# Patient Record
Sex: Female | Born: 1950 | Race: White | Hispanic: No | Marital: Single | Smoking: Former smoker
Health system: Southern US, Community
[De-identification: ages and names within clinical notes are randomized; demographics above are authoritative.]

## PROBLEM LIST (undated history)

## (undated) DIAGNOSIS — E079 Disorder of thyroid, unspecified: Secondary | ICD-10-CM

## (undated) DIAGNOSIS — E785 Hyperlipidemia, unspecified: Secondary | ICD-10-CM

## (undated) DIAGNOSIS — I10 Essential (primary) hypertension: Secondary | ICD-10-CM

## (undated) DIAGNOSIS — E119 Type 2 diabetes mellitus without complications: Secondary | ICD-10-CM

## (undated) DIAGNOSIS — J45909 Unspecified asthma, uncomplicated: Secondary | ICD-10-CM

## (undated) HISTORY — DX: Disorder of thyroid, unspecified: E07.9

## (undated) HISTORY — DX: Essential (primary) hypertension: I10

## (undated) HISTORY — DX: Type 2 diabetes mellitus without complications: E11.9

## (undated) HISTORY — DX: Unspecified asthma, uncomplicated: J45.909

## (undated) HISTORY — PX: WISDOM TOOTH EXTRACTION: SHX21

## (undated) HISTORY — DX: Hyperlipidemia, unspecified: E78.5

---

## 2020-06-13 DIAGNOSIS — E109 Type 1 diabetes mellitus without complications: Secondary | ICD-10-CM | POA: Insufficient documentation

## 2020-07-01 DIAGNOSIS — E785 Hyperlipidemia, unspecified: Secondary | ICD-10-CM | POA: Insufficient documentation

## 2021-03-25 ENCOUNTER — Other Ambulatory Visit: Payer: Self-pay | Admitting: Student

## 2021-03-25 DIAGNOSIS — R0989 Other specified symptoms and signs involving the circulatory and respiratory systems: Secondary | ICD-10-CM

## 2021-04-03 ENCOUNTER — Ambulatory Visit
Admission: RE | Admit: 2021-04-03 | Discharge: 2021-04-03 | Disposition: A | Discharge status: Home / Self Care | Payer: Medicare Other | Source: Ambulatory Visit | Attending: Student | Admitting: Student

## 2021-04-03 DIAGNOSIS — R0989 Other specified symptoms and signs involving the circulatory and respiratory systems: Secondary | ICD-10-CM

## 2021-04-28 ENCOUNTER — Other Ambulatory Visit (HOSPITAL_COMMUNITY): Payer: Self-pay | Admitting: Student

## 2021-04-28 DIAGNOSIS — I10 Essential (primary) hypertension: Secondary | ICD-10-CM

## 2021-05-07 ENCOUNTER — Other Ambulatory Visit: Payer: Self-pay

## 2021-05-07 ENCOUNTER — Encounter: Payer: Self-pay | Admitting: Vascular Surgery

## 2021-05-07 ENCOUNTER — Ambulatory Visit (INDEPENDENT_AMBULATORY_CARE_PROVIDER_SITE_OTHER): Payer: Medicare Other | Admitting: Vascular Surgery

## 2021-05-07 VITALS — BP 182/95 | HR 50 | Temp 98.2°F | Resp 20 | Ht 65.0 in | Wt 171.0 lb

## 2021-05-07 DIAGNOSIS — M25561 Pain in right knee: Secondary | ICD-10-CM | POA: Diagnosis not present

## 2021-05-07 DIAGNOSIS — M25562 Pain in left knee: Secondary | ICD-10-CM

## 2021-05-07 NOTE — Progress Notes (Signed)
Patient ID: Linda Warren, female   DOB: 1950-07-09, 71 y.o.   MRN: [id number]  Reason for Consult: New Patient (Initial Visit)   Referred by Hillery Aldo, NP  Subjective:     HPI:  Linda Warren is a 71 y.o. female without previous vascular history.  She is a former smoker having quit about 20 years ago.  She also has diabetes, hyperlipidemia hypertension which she states are all well controlled.  She does have leg pain this has been a chronic issue.  Mostly the leg pain is from the knees down.  She has been taking Requip to help with restless leg syndrome and this helps quite a bit.  She does not have any claudication.  She has no tissue loss or ulceration.  She denies any previous history of stroke, TIA or amaurosis.  She does not have a personal or family history of aneurysm disease.  She states that she cannot take aspirin due to underlying kidney disease.  Past Medical History:  Diagnosis Date   Asthma    Diabetes mellitus without complication (HCC)    Hyperlipidemia    Hypertension    Thyroid disease    No family history on file.   Short Social History:  Social History   Tobacco Use   Smoking status: Former    Types: Cigarettes   Smokeless tobacco: Never  Substance Use Topics   Alcohol use: Never    Allergies  Allergen Reactions   Nsaids     Other reaction(s): Other    Current Outpatient Medications  Medication Sig Dispense Refill   albuterol (PROVENTIL) (2.5 MG/3ML) 0.083% nebulizer solution Take 2.5 mg by nebulization every 6 (six) hours as needed.     albuterol (VENTOLIN HFA) 108 (90 Base) MCG/ACT inhaler Inhale into the lungs.     furosemide (LASIX) 20 MG tablet Take 20 mg by mouth 2 (two) times daily.     insulin lispro (HUMALOG) 100 UNIT/ML KwikPen Inject into the skin.     LANTUS SOLOSTAR 100 UNIT/ML Solostar Pen Inject into the skin.     levothyroxine (SYNTHROID) 125 MCG tablet Take 125 mcg by mouth daily.     losartan (COZAAR) 25 MG tablet losartan 25 mg  tablet  TAKE ONE TABLET BY MOUTH EVERY DAY     metoprolol tartrate (LOPRESSOR) 100 MG tablet Take 100 mg by mouth 2 (two) times daily.     pravastatin (PRAVACHOL) 10 MG tablet Take 10 mg by mouth at bedtime.     rOPINIRole (REQUIP) 0.5 MG tablet Take 0.5 mg by mouth 2 (two) times daily.     cloNIDine (CATAPRES) 0.1 MG tablet clonidine HCl 0.1 mg tablet  Take 1 tablet twice a day by oral route. (Patient not taking: Reported on 05/07/2021)     omeprazole (PRILOSEC) 20 MG capsule Take by mouth. (Patient not taking: Reported on 05/07/2021)     No current facility-administered medications for this visit.    Review of Systems  Constitutional:  Constitutional negative. HENT: HENT negative.  Eyes: Eyes negative.  Respiratory: Respiratory negative.  Cardiovascular: Cardiovascular negative.  GI: Gastrointestinal negative.  Musculoskeletal: Positive for back pain, gait problem and leg pain.  Hematologic: Hematologic/lymphatic negative.  Psychiatric: Psychiatric negative.       Objective:  Objective  Vitals:   05/07/21 1048  BP: (!) 182/95  Pulse: (!) 50  Resp: 20  Temp: 98.2 F (36.8 C)  SpO2: 96%     Physical Exam HENT:     Head: Normocephalic.  Nose:     Comments: Wearing a mask Eyes:     Pupils: Pupils are equal, round, and reactive to light.  Neck:     Vascular: No carotid bruit.  Cardiovascular:     Rate and Rhythm: Normal rate.     Pulses:          Radial pulses are 2+ on the right side and 2+ on the left side.     Heart sounds: No murmur heard. Pulmonary:     Effort: Pulmonary effort is normal.  Abdominal:     General: Abdomen is flat.     Palpations: Abdomen is soft. There is no mass.  Musculoskeletal:        General: Normal range of motion.     Cervical back: Normal range of motion and neck supple.     Right lower leg: No edema.     Left lower leg: No edema.  Skin:    General: Skin is warm and dry.     Capillary Refill: Capillary refill takes less than 2  seconds.  Neurological:     General: No focal deficit present.     Mental Status: She is alert.  Psychiatric:        Mood and Affect: Mood normal.        Thought Content: Thought content normal.    Data: Right Lower Extremity   Resting ABI:  1.12   Resting TBI: 0.51   Segmental Pressures: Noncompressible vessels throughout the thigh and proximal calf. Great toe pressure: 86   Arterial Waveforms: Normal tri-phasic arterial waveforms.   PVRs: Dampened digital PVRs. Otherwise normal PVRs with maintained waveform amplitude, augmentation and quality.   Left Lower Extremity:   Resting ABI: 1.11   Resting TBI: 0.59   Segmental Pressures: Noncompressible vessels throughout the thigh and proximal calf. Great toe pressure: 100   Arterial Waveforms: Normal tri-phasic arterial waveforms.   PVRs: Dampened digital PVRs. Otherwise normal PVRs with maintained waveform amplitude, augmentation and quality.   Other: Symmetric upper extremity pressures.   IMPRESSION: 1. 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 2. Normal ankle-brachial indices, however likely falsely elevated given impression #1.   Assessment/Plan:    71 year old female with leg pain and multiple risk factors for underlying vascular disease although with palpable pulses and no longer a smoker.  She does not have any evidence of arterial insufficiency.  I recommended continued walking and patient cannot take an antiplatelet agent at this time.  She will follow-up with me on an as-needed basis.     Maeola Harman MD [facility]

## 2021-05-08 ENCOUNTER — Ambulatory Visit (HOSPITAL_COMMUNITY)
Admission: RE | Admit: 2021-05-08 | Discharge: 2021-05-08 | Disposition: A | Discharge status: Home / Self Care | Payer: Medicare Other | Source: Ambulatory Visit | Attending: Student | Admitting: Student

## 2021-05-08 DIAGNOSIS — I10 Essential (primary) hypertension: Secondary | ICD-10-CM | POA: Diagnosis present

## 2021-05-08 DIAGNOSIS — E785 Hyperlipidemia, unspecified: Secondary | ICD-10-CM | POA: Diagnosis not present

## 2021-05-08 DIAGNOSIS — I351 Nonrheumatic aortic (valve) insufficiency: Secondary | ICD-10-CM | POA: Insufficient documentation

## 2021-05-08 LAB — ECHOCARDIOGRAM COMPLETE
Area-P 1/2: 3.08 cm2
Calc EF: 65.2 %
P 1/2 time: 754 msec
S' Lateral: 3.3 cm
Single Plane A2C EF: 67.7 %
Single Plane A4C EF: 63.5 %

## 2021-05-08 NOTE — Progress Notes (Signed)
°  Echocardiogram 2D Echocardiogram has been performed.  Leta Jungling M 05/08/2021, 12:11 PM

## 2021-06-17 ENCOUNTER — Ambulatory Visit (HOSPITAL_BASED_OUTPATIENT_CLINIC_OR_DEPARTMENT_OTHER)
Admission: RE | Admit: 2021-06-17 | Discharge: 2021-06-17 | Disposition: A | Discharge status: Home / Self Care | Payer: Medicare Other | Source: Ambulatory Visit | Attending: Student | Admitting: Student

## 2021-06-17 ENCOUNTER — Other Ambulatory Visit: Payer: Self-pay

## 2021-06-17 ENCOUNTER — Other Ambulatory Visit (HOSPITAL_BASED_OUTPATIENT_CLINIC_OR_DEPARTMENT_OTHER): Payer: Self-pay | Admitting: Student

## 2021-06-17 DIAGNOSIS — R059 Cough, unspecified: Secondary | ICD-10-CM | POA: Diagnosis present

## 2021-06-17 DIAGNOSIS — R0600 Dyspnea, unspecified: Secondary | ICD-10-CM | POA: Insufficient documentation

## 2021-07-21 ENCOUNTER — Other Ambulatory Visit: Payer: Self-pay | Admitting: Registered Nurse

## 2021-07-21 DIAGNOSIS — E2839 Other primary ovarian failure: Secondary | ICD-10-CM

## 2021-07-21 DIAGNOSIS — Z1231 Encounter for screening mammogram for malignant neoplasm of breast: Secondary | ICD-10-CM

## 2021-08-05 ENCOUNTER — Ambulatory Visit: Payer: Medicare Other

## 2021-08-12 ENCOUNTER — Other Ambulatory Visit: Payer: Medicare Other

## 2021-09-15 ENCOUNTER — Other Ambulatory Visit: Payer: Self-pay | Admitting: Registered Nurse

## 2021-09-15 DIAGNOSIS — E2839 Other primary ovarian failure: Secondary | ICD-10-CM

## 2021-09-16 ENCOUNTER — Other Ambulatory Visit: Payer: Medicare Other

## 2022-01-07 ENCOUNTER — Encounter: Payer: Self-pay | Admitting: Podiatry

## 2022-01-07 ENCOUNTER — Ambulatory Visit (INDEPENDENT_AMBULATORY_CARE_PROVIDER_SITE_OTHER): Payer: Medicare Other | Admitting: Podiatry

## 2022-01-07 DIAGNOSIS — Q828 Other specified congenital malformations of skin: Secondary | ICD-10-CM | POA: Diagnosis not present

## 2022-01-07 DIAGNOSIS — I10 Essential (primary) hypertension: Secondary | ICD-10-CM | POA: Insufficient documentation

## 2022-01-07 DIAGNOSIS — E039 Hypothyroidism, unspecified: Secondary | ICD-10-CM | POA: Insufficient documentation

## 2022-01-07 NOTE — Progress Notes (Signed)
Subjective:  Patient ID: Linda Warren, female    DOB: Mar 20, 1951,  MRN: [id number]  Chief Complaint  Patient presents with   Foot Ulcer    71 y.o. female presents with the above complaint.  Patient presents with complaint of right fifth metatarsal base hyperkeratotic lesion/callus/porokeratosis.  Patient said pain for touch is progressive gotten worse.  She wanted to get evaluated make sure that there is no ulceration.  She is a diabetic with last A1c of 7.5%.  She ambulates with regular shoes.  She does not wear diabetic shoes.  She has not seen and was prior to seeing me.  Pain scale is 5 out of 10 aching in nature.   Review of Systems: Negative except as noted in the HPI. Denies N/V/F/Ch.  Past Medical History:  Diagnosis Date   Asthma    Diabetes mellitus without complication ([place])    Hyperlipidemia    Hypertension    Thyroid disease     Current Outpatient Medications:    albuterol (PROVENTIL) (2.5 MG/3ML) 0.083% nebulizer solution, Take 2.5 mg by nebulization every 6 (six) hours as needed., Disp: , Rfl:    albuterol (VENTOLIN HFA) 108 (90 Base) MCG/ACT inhaler, Inhale into the lungs., Disp: , Rfl:    amoxicillin-clavulanate (AUGMENTIN) 500-125 MG tablet, Take 1 tablet by mouth 2 (two) times daily., Disp: , Rfl:    atorvastatin (LIPITOR) 40 MG tablet, Take 1 tablet by mouth daily., Disp: , Rfl:    butalbital-acetaminophen-caffeine (FIORICET) 50-325-40 MG tablet, Take 1 tablet by mouth every 4 (four) hours as needed., Disp: , Rfl:    cephALEXin (KEFLEX) 250 MG capsule, TAKE TWO CAPSULES BY MOUTH TWICE DAILY FOR 7 DAYS, Disp: , Rfl:    ciprofloxacin (CIPRO) 500 MG tablet, TAKE 1 TABLET BY MOUTH ONCE DAILY FOR 7 DAYS, Disp: , Rfl:    cloNIDine (CATAPRES) 0.1 MG tablet, clonidine HCl 0.1 mg tablet  Take 1 tablet twice a day by oral route. (Patient not taking: Reported on 05/07/2021), Disp: , Rfl:    cyclobenzaprine (FLEXERIL) 5 MG tablet, , Disp: , Rfl:    doxycycline (MONODOX) 100 MG  capsule, Take 100 mg by mouth 2 (two) times daily., Disp: , Rfl:    DULoxetine (CYMBALTA) 20 MG capsule, Take 20 mg by mouth daily., Disp: , Rfl:    fluticasone (FLONASE) 50 MCG/ACT nasal spray, INHALE ONE SPRAY IN EACH NOSTRIL EVERY DAY, Disp: , Rfl:    furosemide (LASIX) 20 MG tablet, Take 20 mg by mouth 2 (two) times daily., Disp: , Rfl:    gabapentin (NEURONTIN) 400 MG capsule, , Disp: , Rfl:    HYDROcodone-acetaminophen (NORCO/VICODIN) 5-325 MG tablet, Take 1 tablet by mouth every 6 (six) hours as needed for moderate pain., Disp: , Rfl:    insulin lispro (HUMALOG) 100 UNIT/ML KwikPen, Inject into the skin., Disp: , Rfl:    insulin NPH Human (HUMULIN N) 100 UNIT/ML injection, INJECT 10 UNITS SUBCUTANEOUSLY THREE TIMES DAILY, Disp: , Rfl:    LANTUS SOLOSTAR 100 UNIT/ML Solostar Pen, Inject into the skin., Disp: , Rfl:    levothyroxine (SYNTHROID) 100 MCG tablet, Take 100 mcg by mouth daily., Disp: , Rfl:    levothyroxine (SYNTHROID) 125 MCG tablet, Take 125 mcg by mouth daily., Disp: , Rfl:    loratadine (CLARITIN) 10 MG tablet, Take 1 tablet by mouth daily., Disp: , Rfl:    losartan (COZAAR) 100 MG tablet, Take 1 tablet by mouth daily., Disp: , Rfl:    losartan (COZAAR) 25 MG tablet,  losartan 25 mg tablet  TAKE ONE TABLET BY MOUTH EVERY DAY, Disp: , Rfl:    losartan (COZAAR) 25 MG tablet, Take 1 tablet by mouth daily., Disp: , Rfl:    methocarbamol (ROBAXIN) 500 MG tablet, Take 500 mg by mouth 3 (three) times daily., Disp: , Rfl:    metoprolol succinate (TOPROL-XL) 100 MG 24 hr tablet, Take 100 mg by mouth daily., Disp: , Rfl:    metoprolol tartrate (LOPRESSOR) 100 MG tablet, Take 100 mg by mouth 2 (two) times daily., Disp: , Rfl:    NEUPRO 1 MG/24HR PT24, Place onto the skin., Disp: , Rfl:    omeprazole (PRILOSEC) 20 MG capsule, Take by mouth. (Patient not taking: Reported on 05/07/2021), Disp: , Rfl:    pravastatin (PRAVACHOL) 10 MG tablet, Take 10 mg by mouth at bedtime., Disp: , Rfl:     rOPINIRole (REQUIP) 0.5 MG tablet, Take 0.5 mg by mouth 2 (two) times daily., Disp: , Rfl:    valsartan (DIOVAN) 160 MG tablet, Take 160 mg by mouth daily., Disp: , Rfl:    venlafaxine XR (EFFEXOR-XR) 37.5 MG 24 hr capsule, Take 37.5 mg by mouth daily., Disp: , Rfl:   Social History   Tobacco Use  Smoking Status Former   Types: Cigarettes  Smokeless Tobacco Never    Allergies  Allergen Reactions   Nsaids Other (See Comments)    Other reaction(s): Other   Objective:  There were no vitals filed for this visit. There is no height or weight on file to calculate BMI. Constitutional Well developed. Well nourished.  Vascular Dorsalis pedis pulses palpable bilaterally. Posterior tibial pulses palpable bilaterally. Capillary refill normal to all digits.  No cyanosis or clubbing noted. Pedal hair growth normal.  Neurologic Normal speech. Oriented to person, place, and time. Epicritic sensation to light touch grossly present bilaterally.  Dermatologic Hyperkeratotic lesion with central nucleated core noted to right submetatarsal base.  Pain on palpation to the lesion.  No pinpoint bleeding noted.  No underlying ulceration noted.    Orthopedic: Normal joint ROM without pain or crepitus bilaterally. No visible deformities. No bony tenderness.   Radiographs: None Assessment:   1. Porokeratosis    Plan:  Patient was evaluated and treated and all questions answered.  Right fifth metatarsal base porokeratosis -All questions and concerns were discussed with the patient in extensive detail.  Given the amount of pain that she is especially benefit from debridement of the lesion using chisel blade to handle the lesion was debrided down to healthy dry tissue.  No complication noted no pinpoint bleeding noted. -I discussed shoe gear modification as well.  No follow-ups on file.  Right fifth metatarsal base callus/porokeratosis though no ulcerations A1c 7.5

## 2022-01-29 ENCOUNTER — Other Ambulatory Visit: Payer: Self-pay | Admitting: Student

## 2022-01-29 DIAGNOSIS — E2839 Other primary ovarian failure: Secondary | ICD-10-CM

## 2022-04-21 ENCOUNTER — Ambulatory Visit: Payer: Medicare Other | Admitting: Podiatry

## 2022-04-30 ENCOUNTER — Ambulatory Visit (INDEPENDENT_AMBULATORY_CARE_PROVIDER_SITE_OTHER): Payer: 59 | Admitting: Podiatry

## 2022-04-30 VITALS — BP 136/78

## 2022-04-30 DIAGNOSIS — Q828 Other specified congenital malformations of skin: Secondary | ICD-10-CM | POA: Diagnosis not present

## 2022-04-30 NOTE — Progress Notes (Signed)
Subjective:  Patient ID: Linda Warren, female    DOB: 1950-06-04,  MRN: [id number]  Chief Complaint  Patient presents with   Callouses    72 y.o. female presents with the above complaint.  Patient presents for follow-up of right fifth metatarsal base porokeratotic lesion.  Patient states it came back again debridement does give her relief she denies any other acute complaints she is a diabetic care was  Review of Systems: Negative except as noted in the HPI. Denies N/V/F/Ch.  Past Medical History:  Diagnosis Date   Asthma    Diabetes mellitus without complication ([place])    Hyperlipidemia    Hypertension    Thyroid disease     Current Outpatient Medications:    albuterol (PROVENTIL) (2.5 MG/3ML) 0.083% nebulizer solution, Take 2.5 mg by nebulization every 6 (six) hours as needed., Disp: , Rfl:    albuterol (VENTOLIN HFA) 108 (90 Base) MCG/ACT inhaler, Inhale into the lungs., Disp: , Rfl:    amoxicillin-clavulanate (AUGMENTIN) 500-125 MG tablet, Take 1 tablet by mouth 2 (two) times daily., Disp: , Rfl:    atorvastatin (LIPITOR) 40 MG tablet, Take 1 tablet by mouth daily., Disp: , Rfl:    butalbital-acetaminophen-caffeine (FIORICET) 50-325-40 MG tablet, Take 1 tablet by mouth every 4 (four) hours as needed., Disp: , Rfl:    cephALEXin (KEFLEX) 250 MG capsule, TAKE TWO CAPSULES BY MOUTH TWICE DAILY FOR 7 DAYS, Disp: , Rfl:    ciprofloxacin (CIPRO) 500 MG tablet, TAKE 1 TABLET BY MOUTH ONCE DAILY FOR 7 DAYS, Disp: , Rfl:    cloNIDine (CATAPRES) 0.1 MG tablet, clonidine HCl 0.1 mg tablet  Take 1 tablet twice a day by oral route. (Patient not taking: Reported on 05/07/2021), Disp: , Rfl:    cyclobenzaprine (FLEXERIL) 5 MG tablet, , Disp: , Rfl:    doxycycline (MONODOX) 100 MG capsule, Take 100 mg by mouth 2 (two) times daily., Disp: , Rfl:    DULoxetine (CYMBALTA) 20 MG capsule, Take 20 mg by mouth daily., Disp: , Rfl:    fluticasone (FLONASE) 50 MCG/ACT nasal spray, INHALE ONE SPRAY IN EACH  NOSTRIL EVERY DAY, Disp: , Rfl:    furosemide (LASIX) 20 MG tablet, Take 20 mg by mouth 2 (two) times daily., Disp: , Rfl:    gabapentin (NEURONTIN) 400 MG capsule, , Disp: , Rfl:    HYDROcodone-acetaminophen (NORCO/VICODIN) 5-325 MG tablet, Take 1 tablet by mouth every 6 (six) hours as needed for moderate pain., Disp: , Rfl:    insulin lispro (HUMALOG) 100 UNIT/ML KwikPen, Inject into the skin., Disp: , Rfl:    insulin NPH Human (HUMULIN N) 100 UNIT/ML injection, INJECT 10 UNITS SUBCUTANEOUSLY THREE TIMES DAILY, Disp: , Rfl:    LANTUS SOLOSTAR 100 UNIT/ML Solostar Pen, Inject into the skin., Disp: , Rfl:    levothyroxine (SYNTHROID) 100 MCG tablet, Take 100 mcg by mouth daily., Disp: , Rfl:    levothyroxine (SYNTHROID) 125 MCG tablet, Take 125 mcg by mouth daily., Disp: , Rfl:    loratadine (CLARITIN) 10 MG tablet, Take 1 tablet by mouth daily., Disp: , Rfl:    losartan (COZAAR) 100 MG tablet, Take 1 tablet by mouth daily., Disp: , Rfl:    losartan (COZAAR) 25 MG tablet, losartan 25 mg tablet  TAKE ONE TABLET BY MOUTH EVERY DAY, Disp: , Rfl:    losartan (COZAAR) 25 MG tablet, Take 1 tablet by mouth daily., Disp: , Rfl:    methocarbamol (ROBAXIN) 500 MG tablet, Take 500 mg by mouth 3 (  three) times daily., Disp: , Rfl:    metoprolol succinate (TOPROL-XL) 100 MG 24 hr tablet, Take 100 mg by mouth daily., Disp: , Rfl:    metoprolol tartrate (LOPRESSOR) 100 MG tablet, Take 100 mg by mouth 2 (two) times daily., Disp: , Rfl:    NEUPRO 1 MG/24HR PT24, Place onto the skin., Disp: , Rfl:    omeprazole (PRILOSEC) 20 MG capsule, Take by mouth. (Patient not taking: Reported on 05/07/2021), Disp: , Rfl:    pravastatin (PRAVACHOL) 10 MG tablet, Take 10 mg by mouth at bedtime., Disp: , Rfl:    rOPINIRole (REQUIP) 0.5 MG tablet, Take 0.5 mg by mouth 2 (two) times daily., Disp: , Rfl:    valsartan (DIOVAN) 160 MG tablet, Take 160 mg by mouth daily., Disp: , Rfl:    venlafaxine XR (EFFEXOR-XR) 37.5 MG 24 hr  capsule, Take 37.5 mg by mouth daily., Disp: , Rfl:   Social History   Tobacco Use  Smoking Status Former   Types: Cigarettes  Smokeless Tobacco Never    Allergies  Allergen Reactions   Nsaids Other (See Comments)    Other reaction(s): Other   Objective:   Vitals:   04/30/22 1401  BP: 136/78   There is no height or weight on file to calculate BMI. Constitutional Well developed. Well nourished.  Vascular Dorsalis pedis pulses palpable bilaterally. Posterior tibial pulses palpable bilaterally. Capillary refill normal to all digits.  No cyanosis or clubbing noted. Pedal hair growth normal.  Neurologic Normal speech. Oriented to person, place, and time. Epicritic sensation to light touch grossly present bilaterally.  Dermatologic Hyperkeratotic lesion with central nucleated core noted to right submetatarsal base.  Pain on palpation to the lesion.  No pinpoint bleeding noted.  No underlying ulceration noted.    Orthopedic: Normal joint ROM without pain or crepitus bilaterally. No visible deformities. No bony tenderness.   Radiographs: None Assessment:   No diagnosis found.  Plan:  Patient was evaluated and treated and all questions answered.  Right fifth metatarsal base porokeratosis -All questions and concerns were discussed with the patient in extensive detail.  Given the amount of pain that she is especially benefit from debridement of the lesion using chisel blade to handle the lesion was debrided down to healthy dry tissue.  No complication noted no pinpoint bleeding noted. -I discussed shoe gear modification as well.  No follow-ups on file.  Right fifth metatarsal base callus/porokeratosis though no ulcerations A1c 7.5

## 2022-07-19 ENCOUNTER — Emergency Department (HOSPITAL_COMMUNITY)
Admission: EM | Admit: 2022-07-19 | Discharge: 2022-07-19 | Disposition: A | Discharge status: Home / Self Care | Payer: 59 | Attending: Emergency Medicine | Admitting: Emergency Medicine

## 2022-07-19 ENCOUNTER — Other Ambulatory Visit: Payer: Self-pay

## 2022-07-19 ENCOUNTER — Encounter (HOSPITAL_COMMUNITY): Payer: Self-pay

## 2022-07-19 ENCOUNTER — Emergency Department (HOSPITAL_COMMUNITY): Payer: 59

## 2022-07-19 DIAGNOSIS — R0602 Shortness of breath: Secondary | ICD-10-CM | POA: Diagnosis not present

## 2022-07-19 DIAGNOSIS — S0033XA Contusion of nose, initial encounter: Secondary | ICD-10-CM | POA: Diagnosis not present

## 2022-07-19 DIAGNOSIS — Z794 Long term (current) use of insulin: Secondary | ICD-10-CM | POA: Diagnosis not present

## 2022-07-19 DIAGNOSIS — Z79899 Other long term (current) drug therapy: Secondary | ICD-10-CM | POA: Insufficient documentation

## 2022-07-19 DIAGNOSIS — X58XXXA Exposure to other specified factors, initial encounter: Secondary | ICD-10-CM | POA: Insufficient documentation

## 2022-07-19 DIAGNOSIS — R55 Syncope and collapse: Secondary | ICD-10-CM | POA: Diagnosis not present

## 2022-07-19 DIAGNOSIS — E860 Dehydration: Secondary | ICD-10-CM | POA: Diagnosis not present

## 2022-07-19 DIAGNOSIS — S0992XA Unspecified injury of nose, initial encounter: Secondary | ICD-10-CM | POA: Diagnosis present

## 2022-07-19 DIAGNOSIS — Y92002 Bathroom of unspecified non-institutional (private) residence single-family (private) house as the place of occurrence of the external cause: Secondary | ICD-10-CM | POA: Insufficient documentation

## 2022-07-19 DIAGNOSIS — Y9301 Activity, walking, marching and hiking: Secondary | ICD-10-CM | POA: Insufficient documentation

## 2022-07-19 LAB — CBC WITH DIFFERENTIAL/PLATELET
Abs Immature Granulocytes: 0.02 10*3/uL (ref 0.00–0.07)
Basophils Absolute: 0 10*3/uL (ref 0.0–0.1)
Basophils Relative: 0 %
Eosinophils Absolute: 0.2 10*3/uL (ref 0.0–0.5)
Eosinophils Relative: 3 %
HCT: 37.9 % (ref 36.0–46.0)
Hemoglobin: 12.5 g/dL (ref 12.0–15.0)
Immature Granulocytes: 0 %
Lymphocytes Relative: 17 %
Lymphs Abs: 1.3 10*3/uL (ref 0.7–4.0)
MCH: 29.1 pg (ref 26.0–34.0)
MCHC: 33 g/dL (ref 30.0–36.0)
MCV: 88.3 fL (ref 80.0–100.0)
Monocytes Absolute: 0.6 10*3/uL (ref 0.1–1.0)
Monocytes Relative: 8 %
Neutro Abs: 5.4 10*3/uL (ref 1.7–7.7)
Neutrophils Relative %: 72 %
Platelets: 263 10*3/uL (ref 150–400)
RBC: 4.29 MIL/uL (ref 3.87–5.11)
RDW: 12.3 % (ref 11.5–15.5)
WBC: 7.5 10*3/uL (ref 4.0–10.5)
nRBC: 0 % (ref 0.0–0.2)

## 2022-07-19 LAB — BASIC METABOLIC PANEL
Anion gap: 15 (ref 5–15)
BUN: 62 mg/dL — ABNORMAL HIGH (ref 8–23)
CO2: 24 mmol/L (ref 22–32)
Calcium: 9.1 mg/dL (ref 8.9–10.3)
Chloride: 97 mmol/L — ABNORMAL LOW (ref 98–111)
Creatinine, Ser: 2.22 mg/dL — ABNORMAL HIGH (ref 0.44–1.00)
GFR, Estimated: 23 mL/min — ABNORMAL LOW (ref >60)
Glucose, Bld: 181 mg/dL — ABNORMAL HIGH (ref 70–99)
Potassium: 3.8 mmol/L (ref 3.5–5.1)
Sodium: 136 mmol/L (ref 135–145)

## 2022-07-19 LAB — CBG MONITORING, ED: Glucose-Capillary: 178 mg/dL — ABNORMAL HIGH (ref 70–99)

## 2022-07-19 MED ORDER — SODIUM CHLORIDE 0.9 % IV BOLUS
500.0000 mL | Freq: Once | INTRAVENOUS | Status: AC
  Administered 2022-07-19: 500 mL via INTRAVENOUS

## 2022-07-19 MED ORDER — SODIUM CHLORIDE 0.9 % IV BOLUS
1000.0000 mL | Freq: Once | INTRAVENOUS | Status: AC
  Administered 2022-07-19: 1000 mL via INTRAVENOUS

## 2022-07-19 NOTE — ED Provider Notes (Signed)
[place] [facility]Y [facility] AT [facility] Provider Note   CSN: [id number] Arrival date & time: 07/19/22  1753     History {Add pertinent medical, surgical, social history, OB history to HPI:1} Chief Complaint  Patient presents with   Near Syncope    Humphrey RollsRobin Warren is a 72 y.o. female.  She is brought in by EMS from home.  She said she was sitting at the kitchen table when she began experiencing what she thinks was a panic attack feeling short of breath.  She tried her inhaler without much improvement.  She said she was walking to the bathroom when she had a syncopal event.  She thinks she might of hit her nose.  She denies any headache chest pain shortness of breath neck or back pain numbness or weakness.  She said she has had 1 prior syncopal event in the past.  She thinks she has been drinking enough fluids although has not been eating much because she is trying to lose weight and taking Mounjaro.  EMS said she had positive orthostatics with them and they gave her some IV fluids.  The history is provided by the patient.  Loss of Consciousness Episode history:  Single Most recent episode:  Today Progression:  Resolved Chronicity:  New Context: normal activity   Relieved by:  None tried Worsened by:  Nothing Ineffective treatments:  None tried Associated symptoms: difficulty breathing and shortness of breath   Associated symptoms: no chest pain, no fever, no headaches, no nausea, no visual change and no vomiting        Home Medications Prior to Admission medications   Medication Sig Start Date End Date Taking? Authorizing Provider  albuterol (PROVENTIL) (2.5 MG/3ML) 0.083% nebulizer solution Take 2.5 mg by nebulization every 6 (six) hours as needed. 11/15/20   [provider]  albuterol (VENTOLIN HFA) 108 (90 Base) MCG/ACT inhaler Inhale into the lungs. 01/31/21   [provider]  amoxicillin-clavulanate (AUGMENTIN) 500-125 MG tablet Take 1 tablet by  mouth 2 (two) times daily.    [provider]  atorvastatin (LIPITOR) 40 MG tablet Take 1 tablet by mouth daily.    [provider]  butalbital-acetaminophen-caffeine (FIORICET) 50-325-40 MG tablet Take 1 tablet by mouth every 4 (four) hours as needed.    [provider]  cephALEXin (KEFLEX) 250 MG capsule TAKE TWO CAPSULES BY MOUTH TWICE DAILY FOR 7 DAYS    [provider]  ciprofloxacin (CIPRO) 500 MG tablet TAKE 1 TABLET BY MOUTH ONCE DAILY FOR 7 DAYS    [provider]  cloNIDine (CATAPRES) 0.1 MG tablet clonidine HCl 0.1 mg tablet  Take 1 tablet twice a day by oral route. Patient not taking: Reported on 05/07/2021    [provider]  cyclobenzaprine (FLEXERIL) 5 MG tablet     [provider]  doxycycline (MONODOX) 100 MG capsule Take 100 mg by mouth 2 (two) times daily. 12/29/21   [provider]  DULoxetine (CYMBALTA) 20 MG capsule Take 20 mg by mouth daily. 11/28/21   [provider]  fluticasone (FLONASE) 50 MCG/ACT nasal spray INHALE ONE SPRAY IN EACH NOSTRIL EVERY DAY    [provider]  furosemide (LASIX) 20 MG tablet Take 20 mg by mouth 2 (two) times daily. 03/04/21   [provider]  gabapentin (NEURONTIN) 400 MG capsule     [provider]  HYDROcodone-acetaminophen (NORCO/VICODIN) 5-325 MG tablet Take 1 tablet by mouth every 6 (six) hours as needed for moderate  pain.    [provider]  insulin lispro (HUMALOG) 100 UNIT/ML KwikPen Inject into the skin. 01/31/21   [provider]  insulin NPH Human (HUMULIN N) 100 UNIT/ML injection INJECT 10 UNITS SUBCUTANEOUSLY THREE TIMES DAILY    [provider]  LANTUS SOLOSTAR 100 UNIT/ML Solostar Pen Inject into the skin. 01/31/21   [provider]  levothyroxine (SYNTHROID) 100 MCG tablet Take 100 mcg by mouth daily. 12/07/21   [provider]  levothyroxine (SYNTHROID) 125 MCG tablet Take 125 mcg  by mouth daily. 01/31/21   [provider]  loratadine (CLARITIN) 10 MG tablet Take 1 tablet by mouth daily.    [provider]  losartan (COZAAR) 100 MG tablet Take 1 tablet by mouth daily.    [provider]  losartan (COZAAR) 25 MG tablet losartan 25 mg tablet  TAKE ONE TABLET BY MOUTH EVERY DAY    [provider]  losartan (COZAAR) 25 MG tablet Take 1 tablet by mouth daily.    [provider]  methocarbamol (ROBAXIN) 500 MG tablet Take 500 mg by mouth 3 (three) times daily. 12/29/21   [provider]  metoprolol succinate (TOPROL-XL) 100 MG 24 hr tablet Take 100 mg by mouth daily. 10/03/21   [provider]  metoprolol tartrate (LOPRESSOR) 100 MG tablet Take 100 mg by mouth 2 (two) times daily. 03/04/21   [provider]  NEUPRO 1 MG/24HR PT24 Place onto the skin. 08/26/21   [provider]  omeprazole (PRILOSEC) 20 MG capsule Take by mouth. Patient not taking: Reported on 05/07/2021 04/18/21   [provider]  pravastatin (PRAVACHOL) 10 MG tablet Take 10 mg by mouth at bedtime. 01/31/21   [provider]  rOPINIRole (REQUIP) 0.5 MG tablet Take 0.5 mg by mouth 2 (two) times daily. 04/30/21   [provider]  valsartan (DIOVAN) 160 MG tablet Take 160 mg by mouth daily. 10/03/21   [provider]  venlafaxine XR (EFFEXOR-XR) 37.5 MG 24 hr capsule Take 37.5 mg by mouth daily. 12/29/21   [provider]      Allergies    Nsaids    Review of Systems   Review of Systems  Constitutional:  Negative for fever.  HENT:  Negative for nosebleeds.   Eyes:  Negative for visual disturbance.  Respiratory:  Positive for shortness of breath.   Cardiovascular:  Positive for syncope. Negative for chest pain.  Gastrointestinal:  Negative for nausea and vomiting.  Neurological:  Negative for headaches.    Physical Exam Updated Vital Signs BP (!) 148/59 (BP Location: Left Arm)   Pulse  74   Temp 97.9 F (36.6 C) (Oral)   Resp 15   Ht 5\' 3"  (1.6 m)   Wt 86.6 kg   SpO2 98%   BMI 33.83 kg/m  Physical Exam Vitals and nursing note reviewed.  Constitutional:      General: She is not in acute distress.    Appearance: Normal appearance. She is well-developed.  HENT:     Head: Normocephalic and atraumatic.     Nose:     Comments: She has some swelling and tenderness of her nose.  There is no nosebleed and no septal hematoma.  No facial tenderness otherwise. Eyes:     Conjunctiva/sclera: Conjunctivae normal.  Cardiovascular:     Rate and Rhythm: Normal rate and regular rhythm.     Heart sounds: No murmur heard. Pulmonary:     Effort: Pulmonary effort is normal. No  respiratory distress.     Breath sounds: Normal breath sounds.  Abdominal:     Palpations: Abdomen is soft.     Tenderness: There is no abdominal tenderness. There is no guarding or rebound.  Musculoskeletal:        General: Normal range of motion.     Cervical back: Neck supple.     Right lower leg: No edema.     Left lower leg: No edema.  Skin:    General: Skin is warm and dry.     Capillary Refill: Capillary refill takes less than 2 seconds.  Neurological:     General: No focal deficit present.     Mental Status: She is alert.     Sensory: No sensory deficit.     Motor: No weakness.     ED Results / Procedures / Treatments   Labs (all labs ordered are listed, but only abnormal results are displayed) Labs Reviewed  BASIC METABOLIC PANEL  CBC WITH DIFFERENTIAL/PLATELET  CBG MONITORING, ED    EKG None  Radiology No results found.  Procedures Procedures  {Document cardiac monitor, telemetry assessment procedure when appropriate:1}  Medications Ordered in ED Medications - No data to display  ED Course/ Medical Decision Making/ A&P   {   Click here for ABCD2, HEART and other calculatorsREFRESH Note before signing :1}                          Medical Decision Making Amount and/or  Complexity of Data Reviewed Labs: ordered. Radiology: ordered. ECG/medicine tests: ordered.   This patient complains of ***; this involves an extensive number of treatment Options and is a complaint that carries with it a high risk of complications and morbidity. The differential includes ***  I ordered, reviewed and interpreted labs, which included *** I ordered medication *** and reviewed PMP when indicated. I ordered imaging studies which included *** and I independently    visualized and interpreted imaging which showed *** Additional history obtained from *** Previous records obtained and reviewed *** I consulted *** and discussed lab and imaging findings and discussed disposition.  Cardiac monitoring reviewed, *** Social determinants considered, *** Critical Interventions: ***  After the interventions stated above, I reevaluated the patient and found *** Admission and further testing considered, ***   {Document critical care time when appropriate:1} {Document review of labs and clinical decision tools ie heart score, Chads2Vasc2 etc:1}  {Document your independent review of radiology images, and any outside records:1} {Document your discussion with family members, caretakers, and with consultants:1} {Document social determinants of health affecting pt's care:1} {Document your decision making why or why not admission, treatments were needed:1} Final Clinical Impression(s) / ED Diagnoses Final diagnoses:  None    Rx / DC Orders ED Discharge Orders     None

## 2022-07-19 NOTE — ED Triage Notes (Signed)
Pt BIB [facility] from home w/ syncope. Pt was sitting at the kitchen table and went to the bathroom and passed out in the bathroom. Pt took her own BP at home it was 69/40. EMS took it on the scene at 82/40. Pt has no cardiac hx but takes BP meds. Pt is also diabetic.   EMS VS BP 1st 83/62 arrived at the hospital.... 2nd 116/72 in the truck P 70 O2 96% RA CBG 172

## 2022-07-19 NOTE — Discharge Instructions (Signed)
You are seen in the emergency department for after a fainting episode in which she struck your nose.  Your lab work showed you to be dehydrated and your symptoms improved after IV fluids.  Please drink plenty of fluids and follow-up with your regular doctor.  Use ice to your nose to decrease swelling and bruising.  Return to the emergency department if any worsening or concerning symptoms

## 2022-12-18 ENCOUNTER — Ambulatory Visit: Payer: 59 | Admitting: Podiatry

## 2023-01-01 ENCOUNTER — Ambulatory Visit: Payer: 59 | Admitting: Podiatry

## 2023-01-27 ENCOUNTER — Ambulatory Visit (INDEPENDENT_AMBULATORY_CARE_PROVIDER_SITE_OTHER): Payer: 59 | Admitting: Podiatry

## 2023-01-27 ENCOUNTER — Encounter: Payer: Self-pay | Admitting: Podiatry

## 2023-01-27 VITALS — Ht 63.0 in | Wt 191.0 lb

## 2023-01-27 DIAGNOSIS — Q828 Other specified congenital malformations of skin: Secondary | ICD-10-CM

## 2023-01-27 NOTE — Progress Notes (Signed)
  Subjective:  Patient ID: Linda Warren, female    DOB: 1950-08-04,  MRN: [id number]  Chief Complaint  Patient presents with   Foot Pain    Right foot pain seen before for same issue    72 y.o. female presents with the above complaint.  Patient presents for follow-up of right fifth metatarsal base porokeratotic lesion.  Patient states it came back again debridement does give her relief she denies any other acute complaints she is a diabetic care was  Review of Systems: Negative except as noted in the HPI. Denies N/V/F/Ch.  Past Medical History:  Diagnosis Date   Asthma    Diabetes mellitus without complication (HCC)    Hyperlipidemia    Hypertension    Thyroid disease     Current Outpatient Medications:    furosemide (LASIX) 20 MG tablet, Take 20 mg by mouth 2 (two) times daily., Disp: , Rfl:    insulin lispro (HUMALOG) 100 UNIT/ML KwikPen, Inject into the skin., Disp: , Rfl:    insulin NPH Human (HUMULIN N) 100 UNIT/ML injection, INJECT 10 UNITS SUBCUTANEOUSLY THREE TIMES DAILY, Disp: , Rfl:    levothyroxine (SYNTHROID) 100 MCG tablet, Take 100 mcg by mouth daily., Disp: , Rfl:    methocarbamol (ROBAXIN) 500 MG tablet, Take 500 mg by mouth 3 (three) times daily., Disp: , Rfl:    metoprolol succinate (TOPROL-XL) 100 MG 24 hr tablet, Take 100 mg by mouth daily., Disp: , Rfl:   Social History   Tobacco Use  Smoking Status Former   Types: Cigarettes  Smokeless Tobacco Never    Allergies  Allergen Reactions   Nsaids Other (See Comments)    Other reaction(s): Other   Objective:   There were no vitals filed for this visit.  Body mass index is 33.83 kg/m. Constitutional Well developed. Well nourished.  Vascular Dorsalis pedis pulses palpable bilaterally. Posterior tibial pulses palpable bilaterally. Capillary refill normal to all digits.  No cyanosis or clubbing noted. Pedal hair growth normal.  Neurologic Normal speech. Oriented to person, place, and time. Epicritic  sensation to light touch grossly present bilaterally.  Dermatologic Hyperkeratotic lesion with central nucleated core noted to right submetatarsal base.  Pain on palpation to the lesion.  No pinpoint bleeding noted.  No underlying ulceration noted.    Orthopedic: Normal joint ROM without pain or crepitus bilaterally. No visible deformities. No bony tenderness.   Radiographs: None Assessment:   No diagnosis found.  Plan:  Patient was evaluated and treated and all questions answered.  Right fifth metatarsal base porokeratosis -All questions and concerns were discussed with the patient in extensive detail.  Given the amount of pain that she is especially benefit from debridement of the lesion using chisel blade to handle the lesion was debrided down to healthy dry tissue.  No complication noted no pinpoint bleeding noted. -I discussed shoe gear modification as well.  No follow-ups on file.

## 2023-03-12 ENCOUNTER — Emergency Department (HOSPITAL_BASED_OUTPATIENT_CLINIC_OR_DEPARTMENT_OTHER)
Admission: EM | Admit: 2023-03-12 | Discharge: 2023-03-12 | Disposition: A | Discharge status: Home / Self Care | Payer: 59 | Attending: Emergency Medicine | Admitting: Emergency Medicine

## 2023-03-12 ENCOUNTER — Emergency Department (HOSPITAL_BASED_OUTPATIENT_CLINIC_OR_DEPARTMENT_OTHER): Payer: 59

## 2023-03-12 ENCOUNTER — Other Ambulatory Visit (HOSPITAL_BASED_OUTPATIENT_CLINIC_OR_DEPARTMENT_OTHER): Payer: Self-pay

## 2023-03-12 ENCOUNTER — Encounter (HOSPITAL_BASED_OUTPATIENT_CLINIC_OR_DEPARTMENT_OTHER): Payer: Self-pay | Admitting: Emergency Medicine

## 2023-03-12 ENCOUNTER — Other Ambulatory Visit: Payer: Self-pay

## 2023-03-12 DIAGNOSIS — R0789 Other chest pain: Secondary | ICD-10-CM | POA: Insufficient documentation

## 2023-03-12 DIAGNOSIS — J45909 Unspecified asthma, uncomplicated: Secondary | ICD-10-CM | POA: Insufficient documentation

## 2023-03-12 DIAGNOSIS — E119 Type 2 diabetes mellitus without complications: Secondary | ICD-10-CM | POA: Diagnosis not present

## 2023-03-12 DIAGNOSIS — Z7984 Long term (current) use of oral hypoglycemic drugs: Secondary | ICD-10-CM | POA: Diagnosis not present

## 2023-03-12 DIAGNOSIS — Z79899 Other long term (current) drug therapy: Secondary | ICD-10-CM | POA: Insufficient documentation

## 2023-03-12 DIAGNOSIS — R079 Chest pain, unspecified: Secondary | ICD-10-CM | POA: Diagnosis present

## 2023-03-12 DIAGNOSIS — Z794 Long term (current) use of insulin: Secondary | ICD-10-CM | POA: Insufficient documentation

## 2023-03-12 DIAGNOSIS — M546 Pain in thoracic spine: Secondary | ICD-10-CM | POA: Insufficient documentation

## 2023-03-12 DIAGNOSIS — I10 Essential (primary) hypertension: Secondary | ICD-10-CM | POA: Insufficient documentation

## 2023-03-12 DIAGNOSIS — E039 Hypothyroidism, unspecified: Secondary | ICD-10-CM | POA: Insufficient documentation

## 2023-03-12 LAB — CBC WITH DIFFERENTIAL/PLATELET
Abs Immature Granulocytes: 0.02 10*3/uL (ref 0.00–0.07)
Basophils Absolute: 0 10*3/uL (ref 0.0–0.1)
Basophils Relative: 0 %
Eosinophils Absolute: 0.3 10*3/uL (ref 0.0–0.5)
Eosinophils Relative: 6 %
HCT: 39.2 % (ref 36.0–46.0)
Hemoglobin: 12.8 g/dL (ref 12.0–15.0)
Immature Granulocytes: 0 %
Lymphocytes Relative: 33 %
Lymphs Abs: 1.8 10*3/uL (ref 0.7–4.0)
MCH: 29.2 pg (ref 26.0–34.0)
MCHC: 32.7 g/dL (ref 30.0–36.0)
MCV: 89.3 fL (ref 80.0–100.0)
Monocytes Absolute: 0.4 10*3/uL (ref 0.1–1.0)
Monocytes Relative: 7 %
Neutro Abs: 2.9 10*3/uL (ref 1.7–7.7)
Neutrophils Relative %: 54 %
Platelets: 300 10*3/uL (ref 150–400)
RBC: 4.39 MIL/uL (ref 3.87–5.11)
RDW: 13 % (ref 11.5–15.5)
WBC: 5.5 10*3/uL (ref 4.0–10.5)
nRBC: 0 % (ref 0.0–0.2)

## 2023-03-12 LAB — BASIC METABOLIC PANEL
Anion gap: 9 (ref 5–15)
BUN: 63 mg/dL — ABNORMAL HIGH (ref 8–23)
CO2: 32 mmol/L (ref 22–32)
Calcium: 9.4 mg/dL (ref 8.9–10.3)
Chloride: 99 mmol/L (ref 98–111)
Creatinine, Ser: 1.86 mg/dL — ABNORMAL HIGH (ref 0.44–1.00)
GFR, Estimated: 29 mL/min — ABNORMAL LOW (ref >60)
Glucose, Bld: 97 mg/dL (ref 70–99)
Potassium: 4 mmol/L (ref 3.5–5.1)
Sodium: 140 mmol/L (ref 135–145)

## 2023-03-12 LAB — D-DIMER, QUANTITATIVE: D-Dimer, Quant: 8.08 ug{FEU}/mL — ABNORMAL HIGH (ref 0.00–0.50)

## 2023-03-12 LAB — CBG MONITORING, ED: Glucose-Capillary: 74 mg/dL (ref 70–99)

## 2023-03-12 MED ORDER — ONDANSETRON HCL 4 MG/2ML IJ SOLN
4.0000 mg | Freq: Once | INTRAMUSCULAR | Status: AC
  Administered 2023-03-12: 4 mg via INTRAVENOUS
  Filled 2023-03-12: qty 2

## 2023-03-12 MED ORDER — LACTATED RINGERS IV BOLUS
1000.0000 mL | Freq: Once | INTRAVENOUS | Status: AC
  Administered 2023-03-12: 1000 mL via INTRAVENOUS

## 2023-03-12 MED ORDER — MORPHINE SULFATE (PF) 4 MG/ML IV SOLN
4.0000 mg | Freq: Once | INTRAVENOUS | Status: AC
  Administered 2023-03-12: 4 mg via INTRAVENOUS
  Filled 2023-03-12: qty 1

## 2023-03-12 MED ORDER — OXYCODONE-ACETAMINOPHEN 5-325 MG PO TABS
1.0000 | ORAL_TABLET | Freq: Three times a day (TID) | ORAL | 0 refills | Status: AC | PRN
  Filled 2023-03-12: qty 6, 1d supply, fill #0

## 2023-03-12 MED ORDER — IOHEXOL 350 MG/ML SOLN
75.0000 mL | Freq: Once | INTRAVENOUS | Status: AC | PRN
  Administered 2023-03-12: 75 mL via INTRAVENOUS

## 2023-03-12 NOTE — ED Provider Notes (Signed)
[place] EMERGENCY DEPARTMENT AT [facility] Provider Note   CSN: [id number] Arrival date & time: 03/12/23  1201     History  Chief Complaint  Patient presents with   Back Pain    Linda Warren is a 72 y.o. female.  Patient is a 72 year old female with a history of hypothyroidism, diabetes, hypertension, hyperlipidemia and asthma who does not use tobacco products presenting today with complaint of left rib and side pain.  Symptoms started about 4 days ago.  She cannot recall exactly when it started but has been gradually worsening.  It is worse with movement and taking a deep breath.  She has not had cough or vomiting.  Denies any fever, nausea or abdominal pain.  She has been eating and drinking normally.  No pain that goes into her arm or lower back.  Denies any urinary symptoms.  She has been taking muscle relaxer at home but has not been helping.  The history is provided by the patient.  Back Pain      Home Medications Prior to Admission medications   Medication Sig Start Date End Date Taking? Authorizing Provider  albuterol (VENTOLIN HFA) 108 (90 Base) MCG/ACT inhaler Inhale 1 puff into the lungs 6 (six) times daily. *prn* 02/08/23  Yes [provider]  cholestyramine light (PREVALITE) 4 g packet Take 4 g by mouth 2 (two) times daily. 02/09/23  Yes [provider]  DULoxetine (CYMBALTA) 30 MG capsule Take 30 mg by mouth daily. 01/28/23  Yes [provider]  DULoxetine (CYMBALTA) 60 MG capsule Take 60 mg by mouth daily. 01/07/23  Yes [provider]  gabapentin (NEURONTIN) 300 MG capsule Take 300 mg by mouth 3 (three) times daily. 12/03/22  Yes [provider]  topiramate (TOPAMAX) 50 MG tablet Take 50 mg by mouth 2 (two) times daily. 01/22/23  Yes [provider]  furosemide (LASIX) 20 MG tablet Take 20 mg by mouth 2 (two) times daily. 03/04/21   [provider]  Insulin Disposable Pump (OMNIPOD 5 [id number]  PODS GEN 5) MISC SMARTSIG:SUB-Q Every 3 Days    [provider]  insulin lispro (HUMALOG) 100 UNIT/ML KwikPen Inject into the skin. 01/31/21   [provider]  insulin NPH Human (HUMULIN N) 100 UNIT/ML injection INJECT 10 UNITS SUBCUTANEOUSLY THREE TIMES DAILY    [provider]  levothyroxine (SYNTHROID) 100 MCG tablet Take 100 mcg by mouth daily. 12/07/21   [provider]  methocarbamol (ROBAXIN) 500 MG tablet Take 500 mg by mouth 3 (three) times daily. 12/29/21   [provider]  metoprolol succinate (TOPROL-XL) 100 MG 24 hr tablet Take 100 mg by mouth daily. 10/03/21   [provider]      Allergies    Nsaids    Review of Systems   Review of Systems  Musculoskeletal:  Positive for back pain.    Physical Exam Updated Vital Signs BP 130/61   Pulse 61   Temp 97.6 F (36.4 C)   Resp 16   Ht 5\' 3"  (1.6 m)   Wt 78 kg   SpO2 94%   BMI 30.47 kg/m  Physical Exam Vitals and nursing note reviewed.  Constitutional:      General: She is not in acute distress.    Appearance: She is well-developed.  HENT:     Head: Normocephalic and atraumatic.  Eyes:     Conjunctiva/sclera: Conjunctivae normal.     Pupils: Pupils are equal, round, and reactive to light.  Cardiovascular:     Rate and Rhythm: Normal rate and regular rhythm.     Heart sounds: No murmur heard. Pulmonary:     Effort: Pulmonary effort is normal. No respiratory distress.     Breath sounds: Normal breath sounds. No wheezing or rales.  Chest:     Chest wall: Tenderness present.    Abdominal:     General: There is no distension.     Palpations: Abdomen is soft.     Tenderness: There is no abdominal tenderness. There is no guarding or rebound.  Musculoskeletal:        General: No tenderness. Normal range of motion.     Cervical back: Normal range of motion and neck supple.  Skin:    General: Skin is warm and dry.     Findings: No erythema or rash.  Neurological:      Mental Status: She is alert and oriented to person, place, and time.  Psychiatric:        Behavior: Behavior normal.     ED Results / Procedures / Treatments   Labs (all labs ordered are listed, but only abnormal results are displayed) Labs Reviewed  BASIC METABOLIC PANEL - Abnormal; Notable for the following components:      Result Value   BUN 63 (*)    Creatinine, Ser 1.86 (*)    GFR, Estimated 29 (*)    All other components within normal limits  D-DIMER, QUANTITATIVE - Abnormal; Notable for the following components:   D-Dimer, Quant 8.08 (*)    All other components within normal limits  CBC WITH DIFFERENTIAL/PLATELET  CBG MONITORING, ED    EKG EKG Interpretation Date/Time:  Friday March 12 2023 12:17:46 EST Ventricular Rate:  60 PR Interval:  230 QRS Duration:  84 QT Interval:  432 QTC Calculation: 432 R Axis:   67  Text Interpretation: Sinus rhythm with sinus arrhythmia with 1st degree A-V block with occasional Premature ventricular complexes Low voltage QRS Cannot rule out Anterior infarct , age undetermined No significant change since last tracing When compared with ECG of 19-Jul-2022 18:01, PREVIOUS ECG IS PRESENT Confirmed by Gwyneth Sprout ([id number]) on 03/12/2023 12:20:47 PM  Radiology DG Chest Port 1 View  Result Date: 03/12/2023 CLINICAL DATA:  Left upper back pain and upper flank pain for 4 days. EXAM: PORTABLE CHEST 1 VIEW COMPARISON:  AP chest 07/19/2022, chest two views 06/17/2021 FINDINGS: The cardiac silhouette mediastinal contours are within normal limits for AP technique. Mildly decreased lung volumes. Left basilar retrocardiac density is similar to prior and again favored to represent mild atelectasis versus scarring. No pleural effusion or pneumothorax. No acute skeletal abnormality. IMPRESSION: \ Left basilar retrocardiac density is similar to prior and again favored to represent mild atelectasis versus scarring. Electronically Signed   By: Neita Garnet M.D.   On: 03/12/2023 13:20    Procedures Procedures    Medications Ordered in ED Medications  lactated ringers bolus 1,000 mL (has no administration in time range)  morphine (PF) 4 MG/ML injection 4 mg (4 mg Intravenous Given 03/12/23 1317)  ondansetron (ZOFRAN) injection 4 mg (4 mg Intravenous Given 03/12/23 1316)    ED Course/ Medical Decision Making/ A&P                                 Medical Decision Making Amount and/or Complexity of Data Reviewed Labs: ordered. Radiology: ordered.  Risk Prescription drug management.  Pt with multiple medical problems and comorbidities and presenting today with a complaint that caries a high risk for morbidity and mortality.  Here today with complaints most classic for chest wall pain with pleuritic symptoms and pain worse with movement.  She denies any shortness of breath but sats are 94% on room air.  Concern for PE, pneumothorax, MSK pain, pleurisy.  Lower suspicion for pneumonia as patient has not had a cough or fever.  She has been trying muscle relaxer at home without improvement.  She denies any prior trauma and low suspicion for rib fracture.  Patient has no findings on exam consistent with shingles.  Low suspicion for ACS at this time. I independently interpreted patient's labs and EKG.  EKG without acute findings concerning for ACS.  CBC, CBG within normal limits, BMP today with creatinine of 1.86 with a BUN of 63 from prior creatinine of 1.47 in August.  D-dimer is elevated at 8.  Patient's GFR is 29 and right below the cutoff for CTA.  Discussed with radiology and he recommended bilateral lower extremity Dopplers first to see if there is any evidence of DVT and patient may not need a CT.  In the meantime patient was hydrated as this may be prerenal.  If ultrasound is negative then patient will need the CTA.  Discussed this with the patient and she is comfortable with this plan.         Final Clinical Impression(s) / ED  Diagnoses Final diagnoses:  None    Rx / DC Orders ED Discharge Orders     None         Gwyneth Sprout, MD 03/12/23 1500

## 2023-03-12 NOTE — ED Notes (Signed)
Walked patient to the restroom at this time.

## 2023-03-12 NOTE — ED Provider Notes (Signed)
This is Physical Exam  BP 130/61   Pulse 61   Temp 97.6 F (36.4 C)   Resp 16   Ht 5\' 3"  (1.6 m)   Wt 78 kg   SpO2 94%   BMI 30.47 kg/m   Physical Exam  Procedures  Procedures  ED Course / MDM    Medical Decision Making Amount and/or Complexity of Data Reviewed Labs: ordered. Radiology: ordered.  Risk Prescription drug management.  Patient with left-sided flank pain.  Workup reassuring.  CT scan and does not show clot.  Appears stable for discharge home.  Reviewed drug database.  Will give her short course of 6 pills of narcotics.  Will discharge with outpatient follow-up. No rash seen but has only had 4 days of pain potentially could be early zoster.       Benjiman Core, MD 03/12/23 [phone]

## 2023-03-12 NOTE — ED Triage Notes (Signed)
Pt c/o LT side upper back pain and upper flank pain x 4 days. Denies CP or shob

## 2023-03-12 NOTE — Discharge Instructions (Addendum)
Follow-up with your doctor.  There were no blood clots seen today.  Pain medicine as needed.

## 2023-09-12 IMAGING — DX DG CHEST 2V
2 series · 2 of 2 positions shown · non-contrast
Comparison: None.

CLINICAL DATA: Dyspnea.

EXAM:
CHEST - 2 VIEW

[chest pa]
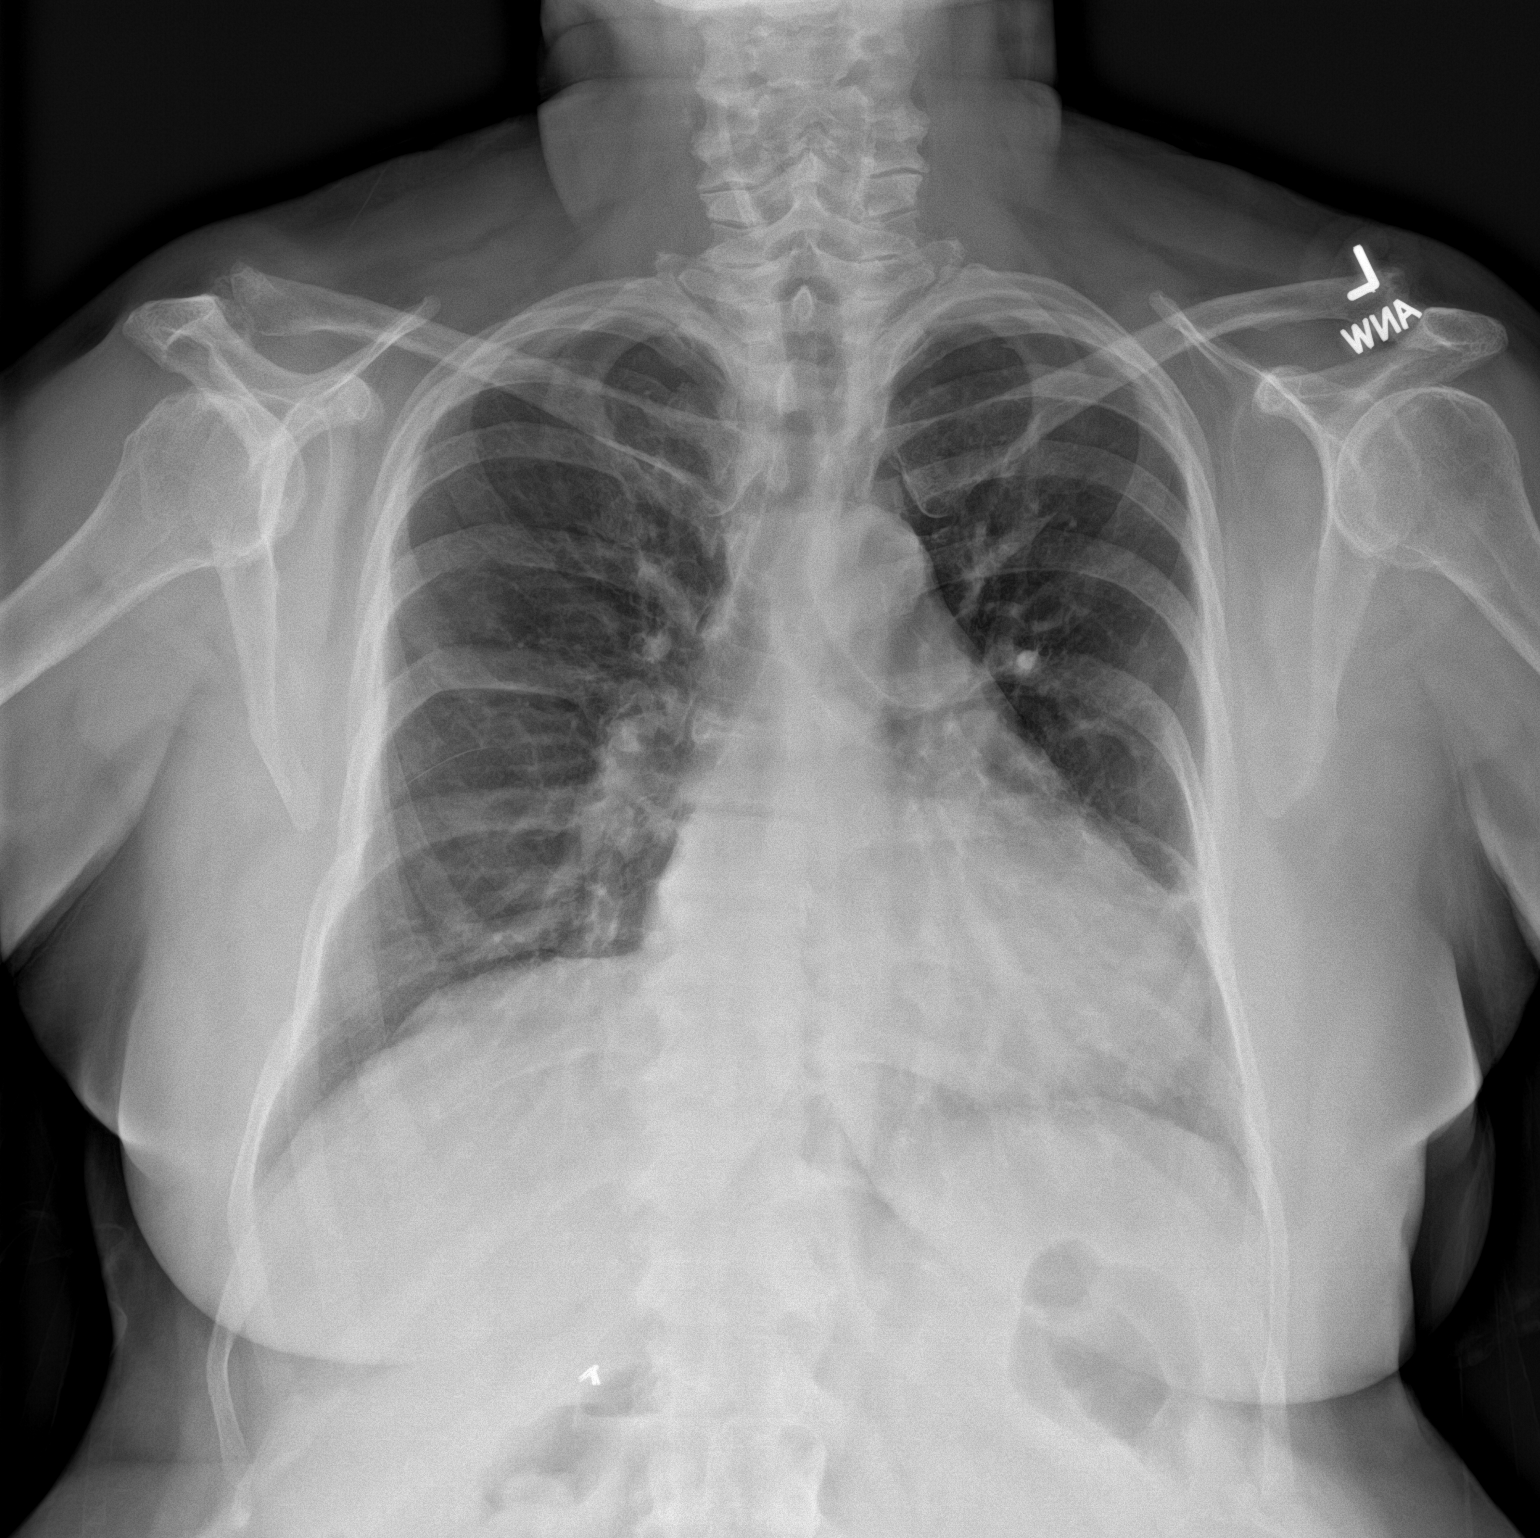

[chest lat]
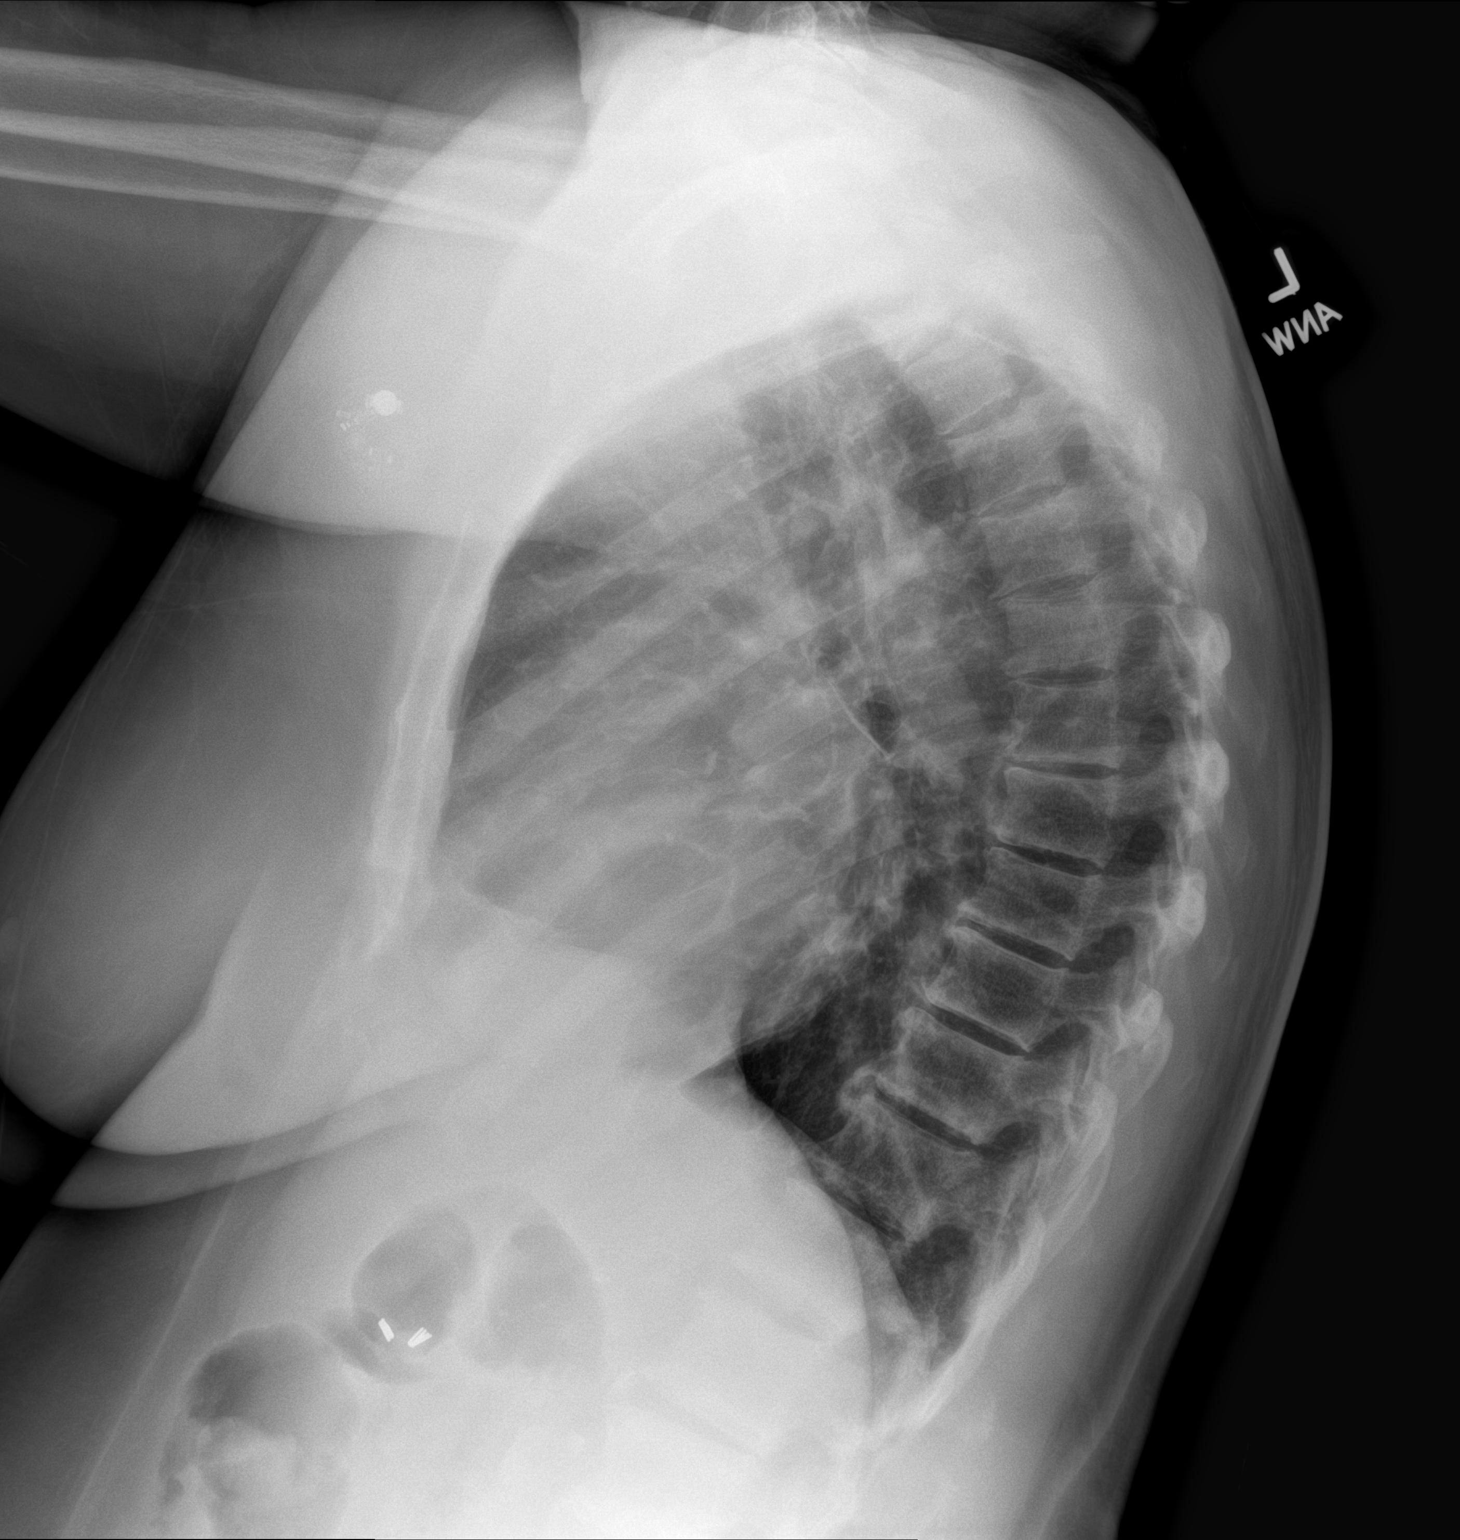

[2 of 2 positions shown; findings below may reference images not displayed]

FINDINGS: Heart is mildly enlarged. No focal lung infiltrate, pleural effusion
pneumothorax. Surgical clips are seen in the upper abdomen. No acute
fractures are identified.
IMPRESSION: Cardiomegaly. No focal lung infiltrate.

## 2023-11-04 ENCOUNTER — Other Ambulatory Visit: Payer: Self-pay | Admitting: Nurse Practitioner

## 2023-11-04 DIAGNOSIS — M5442 Lumbago with sciatica, left side: Secondary | ICD-10-CM

## 2023-11-18 ENCOUNTER — Ambulatory Visit
Admission: RE | Admit: 2023-11-18 | Discharge: 2023-11-18 | Disposition: A | Discharge status: Home / Self Care | Source: Ambulatory Visit | Attending: Nurse Practitioner | Admitting: Nurse Practitioner

## 2023-11-18 DIAGNOSIS — M5442 Lumbago with sciatica, left side: Secondary | ICD-10-CM

## 2023-12-24 ENCOUNTER — Ambulatory Visit: Admitting: Podiatry

## 2024-01-12 ENCOUNTER — Ambulatory Visit (INDEPENDENT_AMBULATORY_CARE_PROVIDER_SITE_OTHER): Admitting: Podiatry

## 2024-01-12 DIAGNOSIS — Q828 Other specified congenital malformations of skin: Secondary | ICD-10-CM

## 2024-01-12 NOTE — Progress Notes (Signed)
 Subjective:  Patient ID: Linda Warren, female    DOB: 12/10/1950,  MRN: [id number]  Chief Complaint  Patient presents with   Porokeratosis    Pt stated that the place has came back     73 y.o. female presents with the above complaint.  Patient presents for follow-up of right fifth metatarsal base porokeratotic lesion.  Patient states it came back again debridement does give her relief she denies any other acute complaints she is a diabetic care was  Review of Systems: Negative except as noted in the HPI. Denies N/V/F/Ch.  Past Medical History:  Diagnosis Date   Asthma    Diabetes mellitus without complication (HCC)    Hyperlipidemia    Hypertension    Thyroid disease     Current Outpatient Medications:    albuterol (VENTOLIN HFA) 108 (90 Base) MCG/ACT inhaler, Inhale 1 puff into the lungs 6 (six) times daily. *prn*, Disp: , Rfl:    cholestyramine light (PREVALITE) 4 g packet, Take 4 g by mouth 2 (two) times daily., Disp: , Rfl:    DULoxetine (CYMBALTA) 30 MG capsule, Take 30 mg by mouth daily., Disp: , Rfl:    DULoxetine (CYMBALTA) 60 MG capsule, Take 60 mg by mouth daily., Disp: , Rfl:    furosemide (LASIX) 20 MG tablet, Take 20 mg by mouth 2 (two) times daily., Disp: , Rfl:    gabapentin (NEURONTIN) 300 MG capsule, Take 300 mg by mouth 3 (three) times daily., Disp: , Rfl:    Insulin Disposable Pump (OMNIPOD 5 [id number] PODS GEN 5) MISC, SMARTSIG:SUB-Q Every 3 Days, Disp: , Rfl:    insulin lispro (HUMALOG) 100 UNIT/ML KwikPen, Inject into the skin., Disp: , Rfl:    insulin NPH Human (HUMULIN N) 100 UNIT/ML injection, INJECT 10 UNITS SUBCUTANEOUSLY THREE TIMES DAILY, Disp: , Rfl:    levothyroxine (SYNTHROID) 100 MCG tablet, Take 100 mcg by mouth daily., Disp: , Rfl:    methocarbamol (ROBAXIN) 500 MG tablet, Take 500 mg by mouth 3 (three) times daily., Disp: , Rfl:    metoprolol succinate (TOPROL-XL) 100 MG 24 hr tablet, Take 100 mg by mouth daily., Disp: , Rfl:     oxyCODONE -acetaminophen  (PERCOCET/ROXICET) 5-325 MG tablet, Take 1-2 tablets by mouth every 8 (eight) hours as needed for severe pain (pain score 7-10)., Disp: 6 tablet, Rfl: 0   topiramate (TOPAMAX) 50 MG tablet, Take 50 mg by mouth 2 (two) times daily., Disp: , Rfl:   Social History   Tobacco Use  Smoking Status Former   Types: Cigarettes  Smokeless Tobacco Never    Allergies  Allergen Reactions   Nsaids Other (See Comments)    Other reaction(s): Other   Objective:   There were no vitals filed for this visit.  There is no height or weight on file to calculate BMI. Constitutional Well developed. Well nourished.  Vascular Dorsalis pedis pulses palpable bilaterally. Posterior tibial pulses palpable bilaterally. Capillary refill normal to all digits.  No cyanosis or clubbing noted. Pedal hair growth normal.  Neurologic Normal speech. Oriented to person, place, and time. Epicritic sensation to light touch grossly present bilaterally.  Dermatologic Hyperkeratotic lesion with central nucleated core noted to right submetatarsal base.  Pain on palpation to the lesion.  No pinpoint bleeding noted.  No underlying ulceration noted.    Orthopedic: Normal joint ROM without pain or crepitus bilaterally. No visible deformities. No bony tenderness.   Radiographs: None Assessment:   1. Porokeratosis     Plan:  Patient was evaluated  and treated and all questions answered.  Right fifth metatarsal base porokeratosis -All questions and concerns were discussed with the patient in extensive detail.  Given the amount of pain that she is especially benefit from debridement of the lesion using chisel blade to handle the lesion was debrided down to healthy dry tissue.  No complication noted no pinpoint bleeding noted. -I discussed shoe gear modification as well.  No follow-ups on file.
